# Patient Record
Sex: Male | Born: 1950 | Hispanic: Yes | Smoking: Never smoker
Health system: Southern US, Community
[De-identification: ages and names within clinical notes are randomized; demographics above are authoritative.]

## PROBLEM LIST (undated history)

## (undated) DIAGNOSIS — E7800 Pure hypercholesterolemia, unspecified: Secondary | ICD-10-CM

## (undated) HISTORY — PX: APPENDECTOMY: SHX54

---

## 2016-02-14 ENCOUNTER — Encounter (HOSPITAL_BASED_OUTPATIENT_CLINIC_OR_DEPARTMENT_OTHER): Payer: Self-pay | Admitting: *Deleted

## 2016-02-14 ENCOUNTER — Emergency Department (HOSPITAL_BASED_OUTPATIENT_CLINIC_OR_DEPARTMENT_OTHER)
Admission: EM | Admit: 2016-02-14 | Discharge: 2016-02-14 | Disposition: A | Discharge status: Home / Self Care | Payer: Medicare Other | Attending: Emergency Medicine | Admitting: Emergency Medicine

## 2016-02-14 ENCOUNTER — Emergency Department (HOSPITAL_BASED_OUTPATIENT_CLINIC_OR_DEPARTMENT_OTHER): Payer: Medicare Other

## 2016-02-14 DIAGNOSIS — Y929 Unspecified place or not applicable: Secondary | ICD-10-CM | POA: Insufficient documentation

## 2016-02-14 DIAGNOSIS — Y999 Unspecified external cause status: Secondary | ICD-10-CM | POA: Insufficient documentation

## 2016-02-14 DIAGNOSIS — S42032A Displaced fracture of lateral end of left clavicle, initial encounter for closed fracture: Secondary | ICD-10-CM | POA: Insufficient documentation

## 2016-02-14 DIAGNOSIS — S4992XA Unspecified injury of left shoulder and upper arm, initial encounter: Secondary | ICD-10-CM | POA: Diagnosis present

## 2016-02-14 DIAGNOSIS — Z79899 Other long term (current) drug therapy: Secondary | ICD-10-CM | POA: Diagnosis not present

## 2016-02-14 DIAGNOSIS — Y9367 Activity, basketball: Secondary | ICD-10-CM | POA: Diagnosis not present

## 2016-02-14 DIAGNOSIS — W1839XA Other fall on same level, initial encounter: Secondary | ICD-10-CM | POA: Insufficient documentation

## 2016-02-14 DIAGNOSIS — S42002A Fracture of unspecified part of left clavicle, initial encounter for closed fracture: Secondary | ICD-10-CM

## 2016-02-14 HISTORY — DX: Pure hypercholesterolemia, unspecified: E78.00

## 2016-02-14 MED ORDER — OXYCODONE-ACETAMINOPHEN 5-325 MG PO TABS
1.0000 | ORAL_TABLET | Freq: Once | ORAL | Status: AC
  Administered 2016-02-14: 1 via ORAL
  Filled 2016-02-14: qty 1

## 2016-02-14 MED ORDER — OXYCODONE-ACETAMINOPHEN 5-325 MG PO TABS: 1.0000 | ORAL_TABLET | Freq: Three times a day (TID) | ORAL | Status: AC | PRN

## 2016-02-14 NOTE — ED Provider Notes (Signed)
CSN: [id number]     Arrival date & time 02/14/16  1848 History  By signing my name below, I, Corey Haas, attest that this documentation has been prepared under the direction and in the presence of Marily MemosJason Arbie Reisz, MD. Electronically Signed: Doreatha MartinEva Haas, ED Scribe. 02/14/2016. 7:30 PM.    Chief Complaint  Patient presents with  . Shoulder Injury   The history is provided by the patient. No language interpreter was used.   HPI Comments: Lu DuffelDavid Haas is a 65 y.o. male who is right hand dominant who presents to the Emergency Department complaining of moderate left shoulder pain s/p injury 1.5 hours ago. Pt states he was playing basketball when he dove for the ball and landed on his left shoulder. Denies LOC or head injury. Pt states that his pain is worsened with arm movement. He reports mild relief of pain with advil. Denies numbness, CP, abdominal pain, back pain, additional injuries.   Past Medical History  Diagnosis Date  . High cholesterol    Past Surgical History  Procedure Laterality Date  . Appendectomy     No family history on file. Social History  Substance Use Topics  . Smoking status: Never Smoker   . Smokeless tobacco: None  . Alcohol Use: Yes    Review of Systems  Cardiovascular: Negative for chest pain.  Gastrointestinal: Negative for abdominal pain.  Musculoskeletal: Positive for arthralgias. Negative for back pain.  Neurological: Negative for numbness.   Allergies  Review of patient's allergies indicates no known allergies.  Home Medications   Prior to Admission medications   Medication Sig Start Date End Date Taking? Authorizing Provider  Atorvastatin Calcium (LIPITOR PO) Take by mouth.   Yes Historical Provider, MD  oxyCODONE-acetaminophen (PERCOCET/ROXICET) 5-325 MG tablet Take 1 tablet by mouth every 8 (eight) hours as needed for severe pain. 02/14/16   Barbara CowerJason Quantez Schnyder, MD   BP 141/88 mmHg  Pulse 95  Temp(Src) 98.4 F (36.9 C) (Oral)  Resp 18  Ht 5\' 8"  (1.727  m)  Wt 170 lb (77.111 kg)  BMI 25.85 kg/m2  SpO2 99% Physical Exam  Constitutional: He is oriented to person, place, and time. He appears well-developed and well-nourished.  Mental status appropriate  HENT:  Head: Normocephalic and atraumatic.  Eyes: Conjunctivae and EOM are normal. Pupils are equal, round, and reactive to light.  Neck: Normal range of motion. Neck supple.  Cardiovascular: Normal rate.   Pulmonary/Chest: Effort normal. No respiratory distress. He exhibits no tenderness.  Abdominal: He exhibits no distension.  Musculoskeletal: Normal range of motion. He exhibits tenderness.  TTP to the distal left clavicle over [facility]. Radial pulses 2+ and equal. Strength and sensation intact to distal left hand. No tenderness to the right arm. Pt is right hand dominant  Neurological: He is alert and oriented to person, place, and time.  Skin: Skin is warm and dry.  Psychiatric: He has a normal mood and affect. His behavior is normal.  Nursing note and vitals reviewed.   ED Course  Procedures (including critical care time) DIAGNOSTIC STUDIES: Oxygen Saturation is 99% on RA, normal by my interpretation.    COORDINATION OF CARE: 7:23 PM Discussed treatment plan with pt at bedside which includes XR and pt agreed to plan. F/u with PCP for repeat XR.   Imaging Review Dg Shoulder Left  02/14/2016  CLINICAL DATA:  Fall wall playing basketball with left shoulder pain, initial encounter EXAM: LEFT SHOULDER - 2+ VIEW COMPARISON:  None. FINDINGS: Mildly displaced distal  left clavicular fracture is seen. It appears comminuted. Downward displacement of the distal fracture fragment is noted. No dislocation of the humeral head is seen. IMPRESSION: Distal left clavicular fracture Electronically Signed   By: Alcide Clever M.D.   On: 02/14/2016 19:38   I have personally reviewed and evaluated these images as part of my medical decision-making.   MDM   Final diagnoses:  Clavicle fracture, left,  closed, initial encounter    Left lateral clavicle fracture. No e/o vascular or neurologic compromise. Exam as above. Likely non op. Sling applied. Pain meds given. Will fu w/ pcp in a couple weeks for repeat XR to ensure healing  New Prescriptions: Discharge Medication List as of 02/14/2016  8:42 PM    START taking these medications   Details  oxyCODONE-acetaminophen (PERCOCET/ROXICET) 5-325 MG tablet Take 1 tablet by mouth every 8 (eight) hours as needed for severe pain., Starting 02/14/2016, Until Discontinued, Print         I have personally and contemperaneously reviewed labs and imaging and used in my decision making as above.   A medical screening exam was performed and I feel the patient has had an appropriate workup for their chief complaint at this time and likelihood of emergent condition existing is low. Their vital signs are stable. They have been counseled on decision, discharge, follow up and which symptoms necessitate immediate return to the emergency department.  They verbally stated understanding and agreement with plan and discharged in stable condition.    I personally performed the services described in this documentation, which was scribed in my presence. The recorded information has been reviewed and is accurate.   [name], MD 02/14/16 2052

## 2016-02-14 NOTE — ED Notes (Signed)
MD at bedside. 

## 2016-02-14 NOTE — ED Notes (Signed)
He was playing basketball and fell onto his left shoulder.

## 2018-04-05 IMAGING — DX DG SHOULDER 2+V*L*
3 series · 3 of 3 positions shown · non-contrast
Comparison: None.

CLINICAL DATA: Fall wall playing basketball with left shoulder
pain, initial encounter

EXAM:
LEFT SHOULDER - 2+ VIEW

[shoulder grashey]
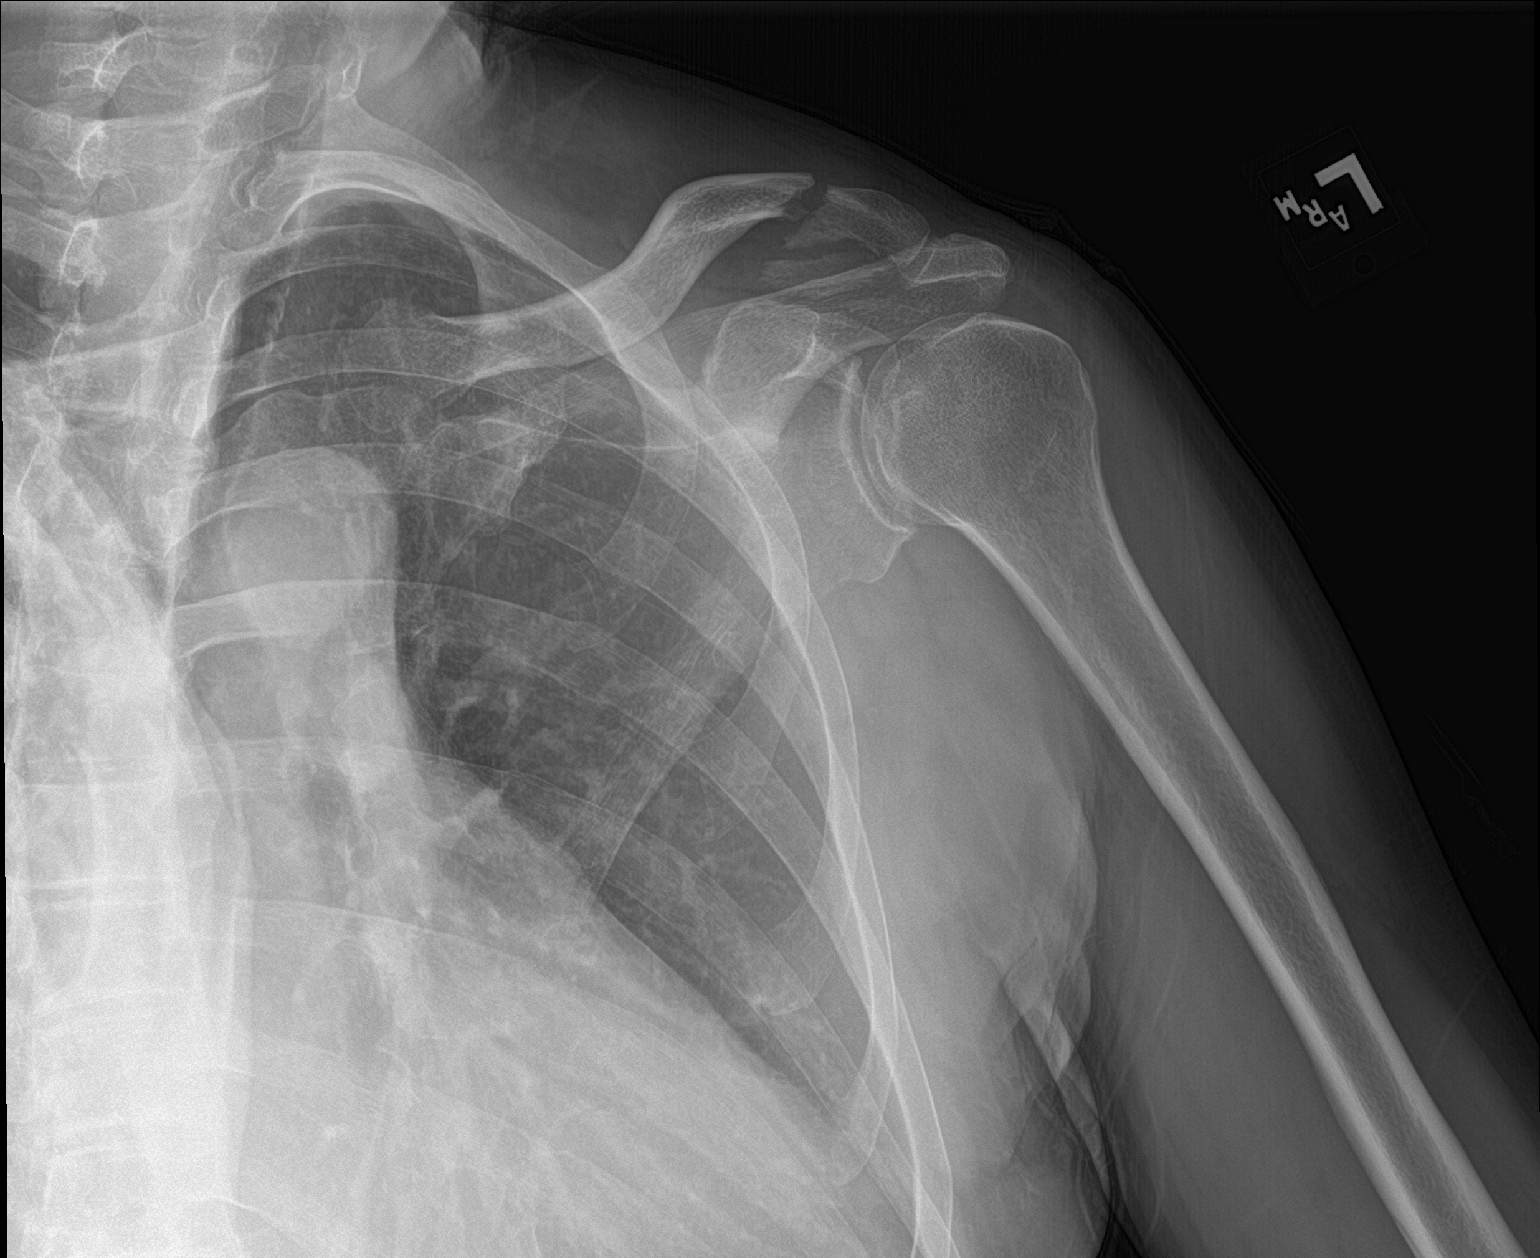

[shoulder y view]
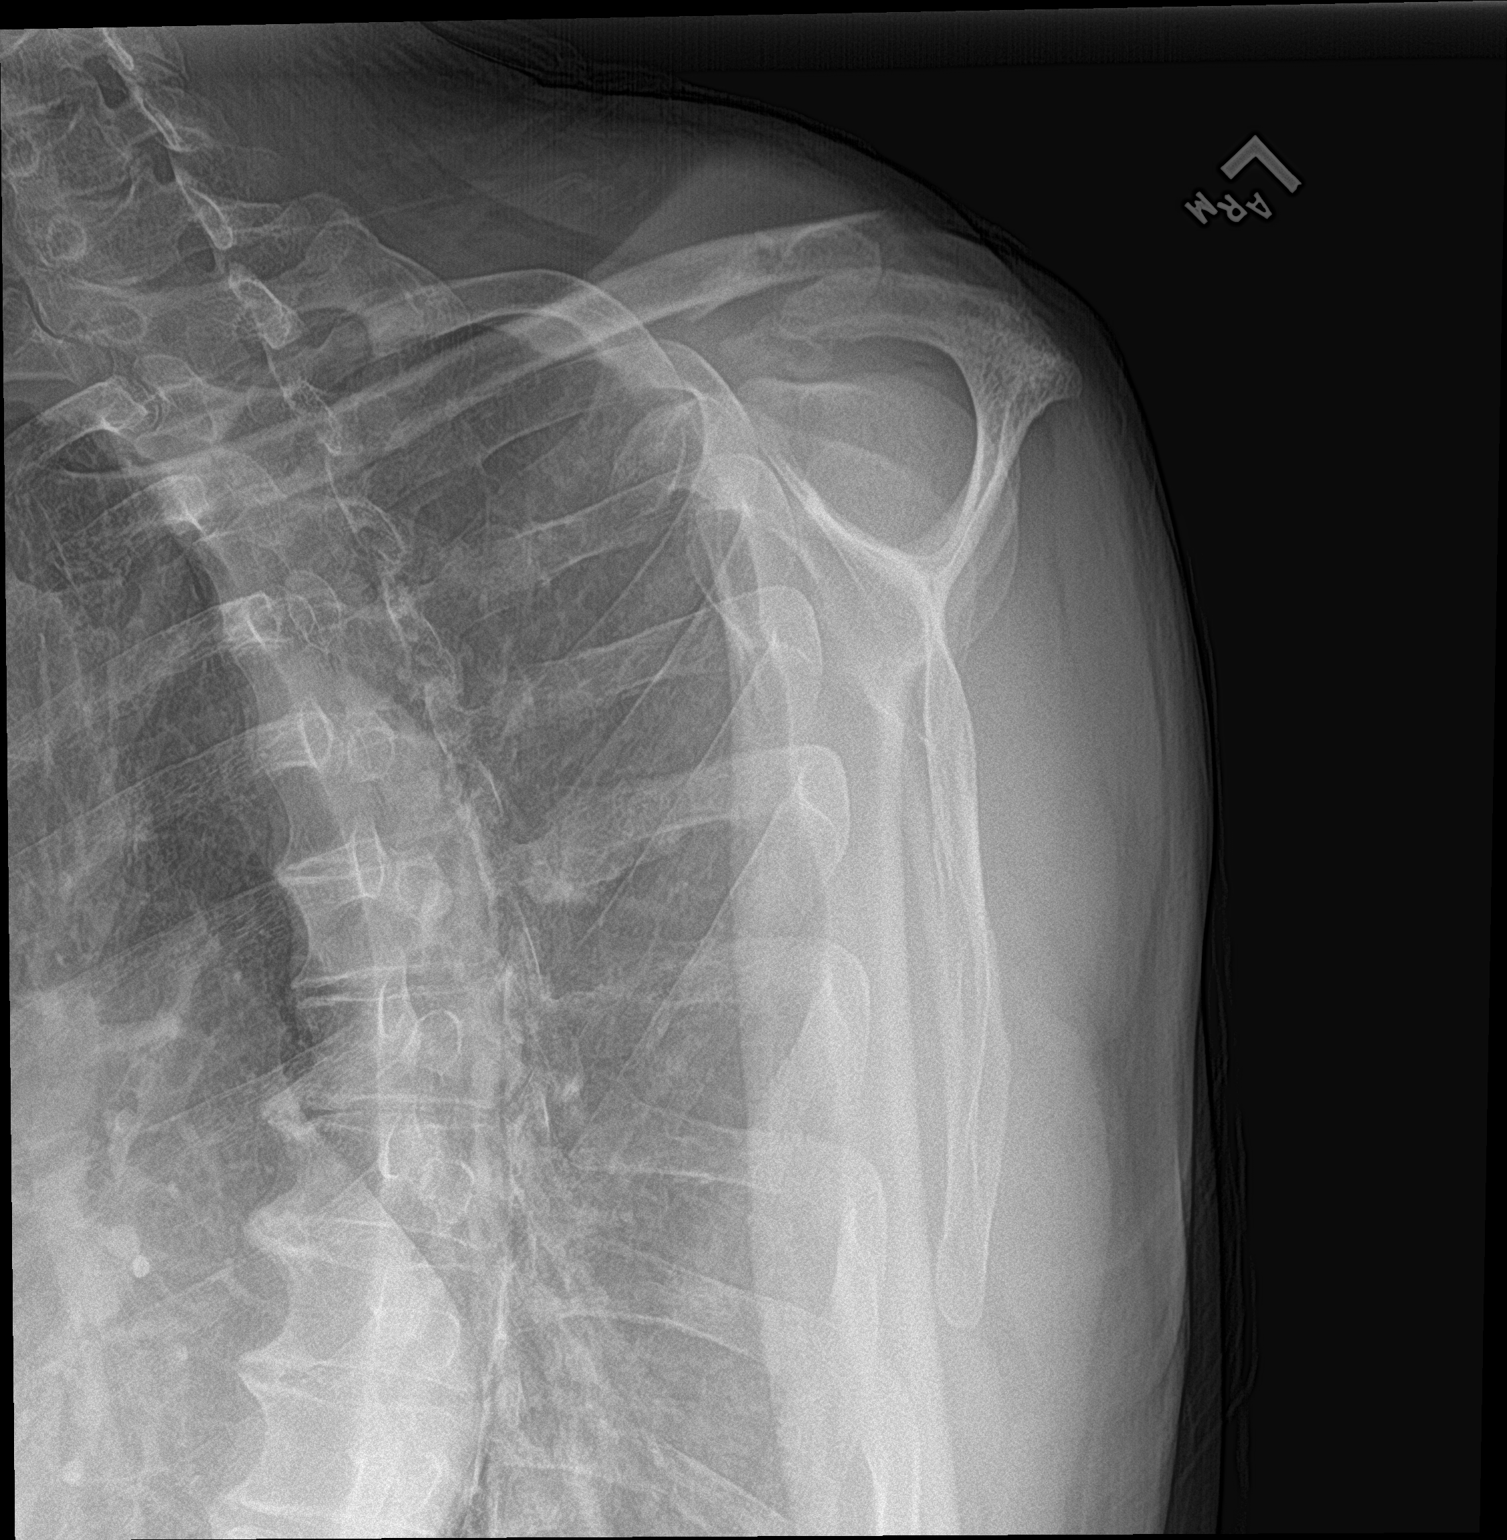

[shoulder axillary]
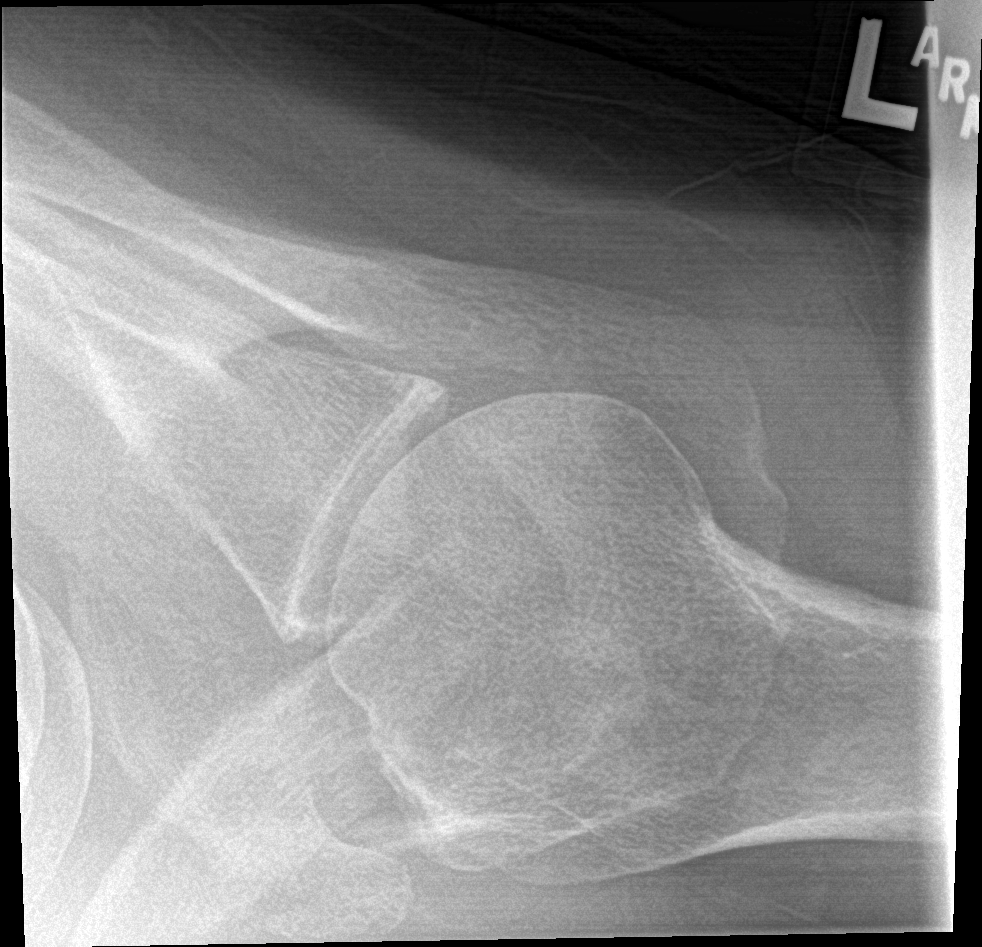

[3 of 3 positions shown; findings below may reference images not displayed]

FINDINGS: Mildly displaced distal left clavicular fracture is seen. It appears
comminuted. Downward displacement of the distal fracture fragment is
noted. No dislocation of the humeral head is seen.
IMPRESSION: Distal left clavicular fracture

## 2020-10-09 DEATH — deceased
